# Patient Record
Sex: Male | Born: 2000 | Race: Black or African American | Hispanic: No | Marital: Single | Smoking: Never smoker
Health system: Southern US, Community
[De-identification: ages and names within clinical notes are randomized; demographics above are authoritative.]

---

## 2019-03-23 ENCOUNTER — Other Ambulatory Visit: Payer: Self-pay

## 2019-03-23 ENCOUNTER — Encounter (HOSPITAL_COMMUNITY): Payer: Self-pay | Admitting: Emergency Medicine

## 2019-03-23 ENCOUNTER — Emergency Department (HOSPITAL_COMMUNITY)
Admission: EM | Admit: 2019-03-23 | Discharge: 2019-03-24 | Disposition: A | Discharge status: Home / Self Care | Payer: Self-pay | Attending: Emergency Medicine | Admitting: Emergency Medicine

## 2019-03-23 ENCOUNTER — Emergency Department (HOSPITAL_COMMUNITY): Payer: Self-pay

## 2019-03-23 DIAGNOSIS — R55 Syncope and collapse: Secondary | ICD-10-CM | POA: Insufficient documentation

## 2019-03-23 DIAGNOSIS — Y999 Unspecified external cause status: Secondary | ICD-10-CM | POA: Insufficient documentation

## 2019-03-23 DIAGNOSIS — Y9239 Other specified sports and athletic area as the place of occurrence of the external cause: Secondary | ICD-10-CM | POA: Insufficient documentation

## 2019-03-23 DIAGNOSIS — W1830XA Fall on same level, unspecified, initial encounter: Secondary | ICD-10-CM | POA: Insufficient documentation

## 2019-03-23 DIAGNOSIS — M542 Cervicalgia: Secondary | ICD-10-CM | POA: Insufficient documentation

## 2019-03-23 DIAGNOSIS — S4991XA Unspecified injury of right shoulder and upper arm, initial encounter: Secondary | ICD-10-CM | POA: Insufficient documentation

## 2019-03-23 DIAGNOSIS — Y9357 Activity, non-running track and field events: Secondary | ICD-10-CM | POA: Insufficient documentation

## 2019-03-23 LAB — URINALYSIS, ROUTINE W REFLEX MICROSCOPIC
Bilirubin Urine: NEGATIVE
Glucose, UA: NEGATIVE mg/dL
Hgb urine dipstick: NEGATIVE
Ketones, ur: 5 mg/dL — AB
Leukocytes,Ua: NEGATIVE
Nitrite: NEGATIVE
Protein, ur: NEGATIVE mg/dL
Specific Gravity, Urine: 1.012 (ref 1.005–1.030)
pH: 6 (ref 5.0–8.0)

## 2019-03-23 LAB — CBC WITH DIFFERENTIAL/PLATELET
Abs Immature Granulocytes: 0.02 10*3/uL (ref 0.00–0.07)
Basophils Absolute: 0 10*3/uL (ref 0.0–0.1)
Basophils Relative: 1 %
Eosinophils Absolute: 0 10*3/uL (ref 0.0–0.5)
Eosinophils Relative: 0 %
HCT: 44.8 % (ref 39.0–52.0)
Hemoglobin: 15 g/dL (ref 13.0–17.0)
Immature Granulocytes: 0 %
Lymphocytes Relative: 22 %
Lymphs Abs: 1.5 10*3/uL (ref 0.7–4.0)
MCH: 27.8 pg (ref 26.0–34.0)
MCHC: 33.5 g/dL (ref 30.0–36.0)
MCV: 83 fL (ref 80.0–100.0)
Monocytes Absolute: 0.6 10*3/uL (ref 0.1–1.0)
Monocytes Relative: 10 %
Neutro Abs: 4.4 10*3/uL (ref 1.7–7.7)
Neutrophils Relative %: 67 %
Platelets: 266 10*3/uL (ref 150–400)
RBC: 5.4 MIL/uL (ref 4.22–5.81)
RDW: 12.3 % (ref 11.5–15.5)
WBC: 6.6 10*3/uL (ref 4.0–10.5)
nRBC: 0 % (ref 0.0–0.2)

## 2019-03-23 LAB — COMPREHENSIVE METABOLIC PANEL
ALT: 24 U/L (ref 0–44)
AST: 34 U/L (ref 15–41)
Albumin: 4.4 g/dL (ref 3.5–5.0)
Alkaline Phosphatase: 66 U/L (ref 38–126)
Anion gap: 12 (ref 5–15)
BUN: 12 mg/dL (ref 6–20)
CO2: 23 mmol/L (ref 22–32)
Calcium: 9.8 mg/dL (ref 8.9–10.3)
Chloride: 103 mmol/L (ref 98–111)
Creatinine, Ser: 1.24 mg/dL (ref 0.61–1.24)
GFR calc Af Amer: >60 mL/min (ref >60)
GFR calc non Af Amer: >60 mL/min (ref >60)
Glucose, Bld: 87 mg/dL (ref 70–99)
Potassium: 4.2 mmol/L (ref 3.5–5.1)
Sodium: 138 mmol/L (ref 135–145)
Total Bilirubin: 0.9 mg/dL (ref 0.3–1.2)
Total Protein: 8 g/dL (ref 6.5–8.1)

## 2019-03-23 LAB — RAPID URINE DRUG SCREEN, HOSP PERFORMED
Amphetamines: NOT DETECTED
Barbiturates: NOT DETECTED
Benzodiazepines: NOT DETECTED
Cocaine: NOT DETECTED
Opiates: NOT DETECTED
Tetrahydrocannabinol: NOT DETECTED

## 2019-03-23 LAB — CBG MONITORING, ED: Glucose-Capillary: 65 mg/dL — ABNORMAL LOW (ref 70–99)

## 2019-03-23 MED ORDER — LIDOCAINE 5 % EX PTCH: 1.0000 | MEDICATED_PATCH | CUTANEOUS | 0 refills | Status: AC

## 2019-03-23 MED ORDER — SODIUM CHLORIDE 0.9 % IV SOLN: INTRAVENOUS | Status: DC

## 2019-03-23 MED ORDER — SODIUM CHLORIDE 0.9 % IV BOLUS
1000.0000 mL | Freq: Once | INTRAVENOUS | Status: AC
  Administered 2019-03-23: 1000 mL via INTRAVENOUS

## 2019-03-23 MED ORDER — KETOROLAC TROMETHAMINE 30 MG/ML IJ SOLN
15.0000 mg | Freq: Once | INTRAMUSCULAR | Status: AC
  Administered 2019-03-23: 15 mg via INTRAVENOUS
  Filled 2019-03-23: qty 1

## 2019-03-23 MED ORDER — METHOCARBAMOL 500 MG PO TABS
500.0000 mg | ORAL_TABLET | Freq: Once | ORAL | Status: AC
  Administered 2019-03-23: 21:00:00 500 mg via ORAL
  Filled 2019-03-23: qty 1

## 2019-03-23 MED ORDER — METHOCARBAMOL 500 MG PO TABS: 500.0000 mg | ORAL_TABLET | Freq: Two times a day (BID) | ORAL | 0 refills | Status: AC
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
## 2019-03-23 NOTE — ED Notes (Signed)
Transported to radiology (xray-CT)
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
## 2019-03-23 NOTE — ED Notes (Signed)
Pt returned from radiology via stretcher

## 2019-03-26 ENCOUNTER — Other Ambulatory Visit: Payer: Self-pay

## 2019-03-26 ENCOUNTER — Encounter: Payer: Self-pay | Admitting: Internal Medicine

## 2019-03-26 ENCOUNTER — Ambulatory Visit (INDEPENDENT_AMBULATORY_CARE_PROVIDER_SITE_OTHER): Payer: Self-pay | Admitting: Internal Medicine

## 2019-03-26 VITALS — BP 122/80 | HR 72 | Temp 97.8°F | Ht 74.0 in | Wt 177.4 lb

## 2019-03-26 DIAGNOSIS — R55 Syncope and collapse: Secondary | ICD-10-CM

## 2019-03-26 NOTE — Patient Instructions (Signed)
Medication Instructions:  NO CHANGES *If you need a refill on your cardiac medications before your next appointment, please call your pharmacy*  Lab Work: NOT NEEDED If you have labs (blood work) drawn today and your tests are completely normal, you will receive your results only by: Marland Kitchen MyChart Message (if you have MyChart) OR . A paper copy in the mail If you have any lab test that is abnormal or we need to change your treatment, we will call you to review the results.  Testing/Procedures Your physician has requested that you have an echocardiogram. Echocardiography is a painless test that uses sound waves to create images of your heart. It provides your doctor with information about the size and shape of your heart and how well your heart's chambers and valves are working. This procedure takes approximately one hour. There are no restrictions for this procedure.    Follow-Up: At [facility], you and your health needs are our priority.  As part of our continuing mission to provide you with exceptional heart care, we have created designated Provider Care Teams.  These Care Teams include your primary Cardiologist (physician) and Advanced Practice Providers (APPs -  Physician Assistants and Nurse Practitioners) who all work together to provide you with the care you need, when you need it.  Your next appointment:   1 week(s)  The format for your next appointment:   Virtual Visit   Provider:   Weston Brass, MD  Other Instructions N/A

## 2019-03-26 NOTE — Progress Notes (Signed)
Cardiology Office Note:    Date:  03/26/2019   ID:  Keith Solomon, DOB [birth year], MRN [id number]  PCP:  Parke Poisson, MD  Cardiologist:  No primary care provider on file.  Electrophysiologist:  None   Referring MD: Anselm Pancoast, PA-C   Chief Complaint: possible syncopal episode  History of Present Illness:    Keith Solomon is a 19 y.o. male with no significant past medical history who presents today for evaluation of a possible syncopal episode versus trauma with loss of consciousness during practice.  He is a Holiday representative in excellent health.  He was at practice preparing for a race that involved hurdles.  He heard the starting gun go off and within a few seconds was that the first hurdle.  He remembers his foot catching on the hurdle and looking sideways at his coach.  He does not remember hitting his head.  Upon talking to his athletic trainer by phone at the end of our visit, she feels he did not lose consciousness in the air, but rather hit his head and had a concussion.  He was having a regular day for himself, and in fact was in good spirits, telling me he was pumping himself up to perform very well in practice raise.  He had an episode of chest pain in 2017 - 8/10 squeezing.  He underwent an evaluation in [place] where he was at the time.  He tells me a treadmill test was completely normal.  At the time he had a sharp pain across chest that improved with position change, massage.   He denies current chest pain, shortness of breath, palpitations.  Denies any other episodes of lifetime syncope or presyncope.  No known family history of accidental drowning's, sudden cardiac death, or other cardiovascular issues.  He has no known history of long QT syndrome.  No past medical history on file.  No past surgical history on file.  Current Medications: Current Meds  Medication Sig  . Acetaminophen-Caffeine (PANADOL EXTRA PO) Take 1 tablet by mouth  as needed (pain and headache).  . lidocaine (LIDODERM) 5 % Place 1 patch onto the skin daily. Remove & Discard patch within 12 hours or as directed by MD  . methocarbamol (ROBAXIN) 500 MG tablet Take 1 tablet (500 mg total) by mouth 2 (two) times daily.     Allergies:   Patient has no known allergies.   Social History   Socioeconomic History  . Marital status: Single    Spouse name: Not on file  . Number of children: Not on file  . Years of education: Not on file  . Highest education level: Not on file  Occupational History  . Not on file  Tobacco Use  . Smoking status: Never Smoker  . Smokeless tobacco: Never Used  Substance and Sexual Activity  . Alcohol use: Not Currently  . Drug use: Never  . Sexual activity: Not on file  Other Topics Concern  . Not on file  Social History Narrative  . Not on file   Social Determinants of Health   Financial Resource Strain:   . Difficulty of Paying Living Expenses: Not on file  Food Insecurity:   . Worried About Programme researcher, broadcasting/film/video in the Last Year: Not on file  . Ran Out of Food in the Last Year: Not on file  Transportation Needs:   . Lack of Transportation (Medical): Not on file  . Lack of Transportation (Non-Medical):  Not on file  Physical Activity:   . Days of Exercise per Week: Not on file  . Minutes of Exercise per Session: Not on file  Stress:   . Feeling of Stress : Not on file  Social Connections:   . Frequency of Communication with Friends and Family: Not on file  . Frequency of Social Gatherings with Friends and Family: Not on file  . Attends Religious Services: Not on file  . Active Member of Clubs or Organizations: Not on file  . Attends Banker Meetings: Not on file  . Marital Status: Not on file     Family History: The patient's family history is not on file.  Noted in HPI.  ROS:   Please see the history of present illness.    All other systems reviewed and are negative.  EKGs/Labs/Other  Studies Reviewed:    The following studies were reviewed today:  EKG:  NSR, normal QTc 429 ms  Recent Labs: 03/23/2019: ALT 24; BUN 12; Creatinine, Ser 1.24; Hemoglobin 15.0; Platelets 266; Potassium 4.2; Sodium 138  Recent Lipid Panel No results found for: CHOL, TRIG, HDL, CHOLHDL, VLDL, LDLCALC, LDLDIRECT  Physical Exam:    VS:  BP 122/80   Pulse 72   Temp 97.8 F (36.6 C)   Ht 6\' 2"  (1.88 m)   Wt 177 lb 6.4 oz (80.5 kg)   SpO2 98%   BMI 22.78 kg/m     Wt Readings from Last 5 Encounters:  03/26/19 177 lb 6.4 oz (80.5 kg)  03/23/19 175 lb 9.6 oz (79.7 kg)     Constitutional: No acute distress Eyes: sclera non-icteric, normal conjunctiva and lids ENMT: Mask in place over nose and mouth Cardiovascular: regular rhythm, normal rate, no murmurs. S1 and S2 normal. Radial pulses normal bilaterally. No jugular venous distention.  Respiratory: clear to auscultation bilaterally GI : normal bowel sounds, soft and nontender. No distention.   MSK: extremities warm, well perfused. No edema.  NEURO: grossly nonfocal exam, moves all extremities. PSYCH: alert and oriented x 3, normal mood and affect.   ASSESSMENT:    1. Syncope, unspecified syncope type    PLAN:    Possible syncopal episode-cannot entirely exclude a syncopal episode, however it would be unusual for him to have unexplained lone episode of syncope.  His athletic trainer feels that it what likely transpired was he tripped, hit his head on his shoulder and suffered a concussion with temporary loss of consciousness.  Since we cannot be certain, we will perform an evaluation with an echocardiogram.  This will be to evaluate for structural causes of syncope including LVOT obstruction and other issues that may contribute to arrhythmia.  I have counseled the patient on not driving.  Fortunately the patient does not currently drive.  If his echocardiogram is normal, it would be safe for him to return to play given the low likelihood  of malignant arrhythmia given the story.  Though a loud noise could contribute to a long QT syndrome, he has a normal QT interval on his ECG, and his syncope was not instantaneous after the loud sound.  We would however want to exclude malignant arrhythmia particularly during his activities.  I will have him wear a cardiac monitor for approximately 2 weeks.  This should not preclude him from activity unless symptoms continue.  Total time of encounter: 60 minutes total time of encounter, including 32 minutes spent in face-to-face patient care. This time includes coordination of care and counseling regarding  diagnosis and management of syncope and athlete. Remainder of non-face-to-face time involved reviewing chart documents/testing relevant to the patient encounter and documentation in the medical record.  I also spent approximately 5 minutes speaking to the patient's athletic trainer for collaborative history by phone due to COVID-19 pandemic.  Cherlynn Kaiser, MD [place]  [facility]   Medication Adjustments/Labs and Tests Ordered: Current medicines are reviewed at length with the patient today.  Concerns regarding medicines are outlined above.  Orders Placed This Encounter  Procedures  . EKG 12-Lead  . ECHOCARDIOGRAM COMPLETE   No orders of the defined types were placed in this encounter.   Patient Instructions  Medication Instructions:  NO CHANGES *If you need a refill on your cardiac medications before your next appointment, please call your pharmacy*  Lab Work: NOT NEEDED If you have labs (blood work) drawn today and your tests are completely normal, you will receive your results only by: Marland Kitchen MyChart Message (if you have MyChart) OR . A paper copy in the mail If you have any lab test that is abnormal or we need to change your treatment, we will call you to review the results.  Testing/Procedures Your physician has requested that you have an echocardiogram. Echocardiography  is a painless test that uses sound waves to create images of your heart. It provides your doctor with information about the size and shape of your heart and how well your heart's chambers and valves are working. This procedure takes approximately one hour. There are no restrictions for this procedure.    Follow-Up: At [facility], you and your health needs are our priority.  As part of our continuing mission to provide you with exceptional heart care, we have created designated Provider Care Teams.  These Care Teams include your primary Cardiologist (physician) and Advanced Practice Providers (APPs -  Physician Assistants and Nurse Practitioners) who all work together to provide you with the care you need, when you need it.  Your next appointment:   1 week(s)  The format for your next appointment:   Virtual Visit   Provider:   Cherlynn Kaiser, MD  Other Instructions N/A

## 2019-03-30 ENCOUNTER — Other Ambulatory Visit: Payer: Self-pay

## 2019-03-30 ENCOUNTER — Ambulatory Visit (HOSPITAL_COMMUNITY)
Admission: RE | Admit: 2019-03-30 | Discharge: 2019-03-30 | Disposition: A | Discharge status: Home / Self Care | Payer: BC Managed Care – PPO | Source: Ambulatory Visit | Attending: Internal Medicine | Admitting: Internal Medicine

## 2019-03-30 ENCOUNTER — Telehealth: Payer: Self-pay | Admitting: *Deleted

## 2019-03-30 DIAGNOSIS — R55 Syncope and collapse: Secondary | ICD-10-CM

## 2019-03-30 NOTE — Progress Notes (Signed)
Echocardiogram 2D Echocardiogram has been performed.  Keith Solomon 03/30/2019, 9:13 AM

## 2019-03-30 NOTE — Telephone Encounter (Signed)
Left message to call back -   Result to be given for echo   and discuss upcoming virtual appt 04/01/19, need schedule 2 week event monitor

## 2019-03-30 NOTE — Telephone Encounter (Addendum)
-   Keith Solomon A,MD  Normal Echo.   Let's order the 2 week event monitor, and have him participate in his usual activities. He can return to practice and competition from a cardiac standpoint.

## 2019-03-31 NOTE — Telephone Encounter (Signed)
The patient has been notified of the result and verbalized understanding.  All questions (if any) were answered. PATIENT IS AWARE CAN CANCELL APPT 04/01/19 NEED TO RESCHEDULE AFTER  ZIO MONITOR IS WORN  AWARE TO   Tobin Chad, RN 03/31/2019 9:25 AM

## 2019-04-01 ENCOUNTER — Telehealth: Payer: Self-pay | Admitting: Internal Medicine

## 2019-04-02 ENCOUNTER — Telehealth: Payer: Self-pay | Admitting: Internal Medicine

## 2019-04-06 ENCOUNTER — Other Ambulatory Visit: Payer: Self-pay | Admitting: Family Medicine

## 2019-04-06 DIAGNOSIS — M25511 Pain in right shoulder: Secondary | ICD-10-CM

## 2019-04-10 ENCOUNTER — Other Ambulatory Visit: Payer: Self-pay

## 2019-04-13 ENCOUNTER — Telehealth: Payer: Self-pay | Admitting: Internal Medicine

## 2019-04-13 NOTE — Telephone Encounter (Signed)
Katie, Event organiser for [employer]. She wanted to know if she would be able to pick up an event monitor for the patient to wear. The patient does not have a [place] mailing address, so there is no way to ship one to him locally.  Please reach out to her so she can arrange a trip to the office to pick it up

## 2019-04-13 NOTE — Telephone Encounter (Signed)
Routed to primary nurse, Andee Lineman, Kelle Darting

## 2019-04-14 ENCOUNTER — Encounter (INDEPENDENT_AMBULATORY_CARE_PROVIDER_SITE_OTHER): Payer: BC Managed Care – PPO

## 2019-04-14 ENCOUNTER — Ambulatory Visit
Admission: RE | Admit: 2019-04-14 | Discharge: 2019-04-14 | Disposition: A | Discharge status: Home / Self Care | Payer: BC Managed Care – PPO | Source: Ambulatory Visit | Attending: Family Medicine | Admitting: Family Medicine

## 2019-04-14 DIAGNOSIS — R55 Syncope and collapse: Secondary | ICD-10-CM | POA: Diagnosis not present

## 2019-04-14 DIAGNOSIS — M25511 Pain in right shoulder: Secondary | ICD-10-CM

## 2019-04-14 NOTE — Telephone Encounter (Signed)
Spoke with trainer she will stop by today and pick up

## 2019-04-14 NOTE — Telephone Encounter (Signed)
I spoke to his trainier on 2/10 she asked me to leave her a message on 2/11 if we had a monitor available. I called her on 2/11 and left her a message that his monitor would be available for pick up on 2/16 first thing in the morning. Patient was enrolled on 2/16 and monitor was placed for pickup downstairs at that time. I will call trainer and let her know its sitting downstairs waiting for her to pick up

## 2019-04-17 ENCOUNTER — Other Ambulatory Visit: Payer: Self-pay

## 2019-05-07 ENCOUNTER — Ambulatory Visit: Payer: BC Managed Care – PPO | Attending: Family

## 2019-05-07 DIAGNOSIS — Z23 Encounter for immunization: Secondary | ICD-10-CM

## 2019-05-07 NOTE — Progress Notes (Signed)
   Covid-19 Vaccination Clinic  Name:  Keith Solomon    MRN: [id number] DOB: 07-24-2000  05/07/2019  Keith Solomon was observed post Covid-19 immunization for 15 minutes without incident. He was provided with Vaccine Information Sheet and instruction to access the V-Safe system.   Keith Solomon was instructed to call 911 with any severe reactions post vaccine: Marland Kitchen Difficulty breathing  . Swelling of face and throat  . A fast heartbeat  . A bad rash all over body  . Dizziness and weakness   Immunizations Administered    Name Date Dose VIS Date Route   Moderna COVID-19 Vaccine 05/07/2019  1:09 PM 0.5 mL 01/20/2019 Intramuscular   Manufacturer: Moderna   Lot: [id number]   NDC: 59563-875-64

## 2019-06-09 ENCOUNTER — Ambulatory Visit: Payer: BC Managed Care – PPO | Attending: Family

## 2019-06-09 DIAGNOSIS — Z23 Encounter for immunization: Secondary | ICD-10-CM

## 2019-06-09 NOTE — Progress Notes (Signed)
   Covid-19 Vaccination Clinic  Name:  Keith Solomon    MRN: [id number] DOB: [birth year]  06/09/2019  [name] was observed post Covid-19 immunization for 15 minutes without incident. He was provided with Vaccine Information Sheet and instruction to access the V-Safe system.   [name] was instructed to call 911 with any severe reactions post vaccine: Marland Kitchen Difficulty breathing  . Swelling of face and throat  . A fast heartbeat  . A bad rash all over body  . Dizziness and weakness   Immunizations Administered    Name Date Dose VIS Date Route   Moderna COVID-19 Vaccine 06/09/2019 11:34 AM 0.5 mL 01/2019 Intramuscular   Manufacturer: Moderna   Lot: [id number]   NDC: 33383-291-91

## 2020-03-25 ENCOUNTER — Ambulatory Visit: Payer: BC Managed Care – PPO | Attending: Family

## 2020-03-25 DIAGNOSIS — Z23 Encounter for immunization: Secondary | ICD-10-CM

## 2020-07-13 NOTE — Progress Notes (Signed)
   Covid-19 Vaccination Clinic  Name:  Keith Solomon    MRN: [id number] DOB: [birth year]  07/13/2020  Keith Solomon was observed post Covid-19 immunization for 15 minutes without incident. He was provided with Vaccine Information Sheet and instruction to access the V-Safe system.   Keith Solomon was instructed to call 911 with any severe reactions post vaccine: Marland Kitchen Difficulty breathing  . Swelling of face and throat  . A fast heartbeat  . A bad rash all over body  . Dizziness and weakness   Immunizations Administered    Name Date Dose VIS Date Route   Moderna Covid-19 Booster Vaccine 03/25/2020  1:30 PM 0.25 mL 12/09/2019 Intramuscular   Manufacturer: Gala Murdoch   Lot: [id number]   NDC: 97353-299-24

## 2021-02-28 IMAGING — DX DG SHOULDER 2+V*R*
3 series · 3 of 3 positions shown · non-contrast
Comparison: None.

CLINICAL DATA: Pain status post fall

EXAM:
RIGHT SHOULDER - 2+ VIEW

[shoulder grashey]
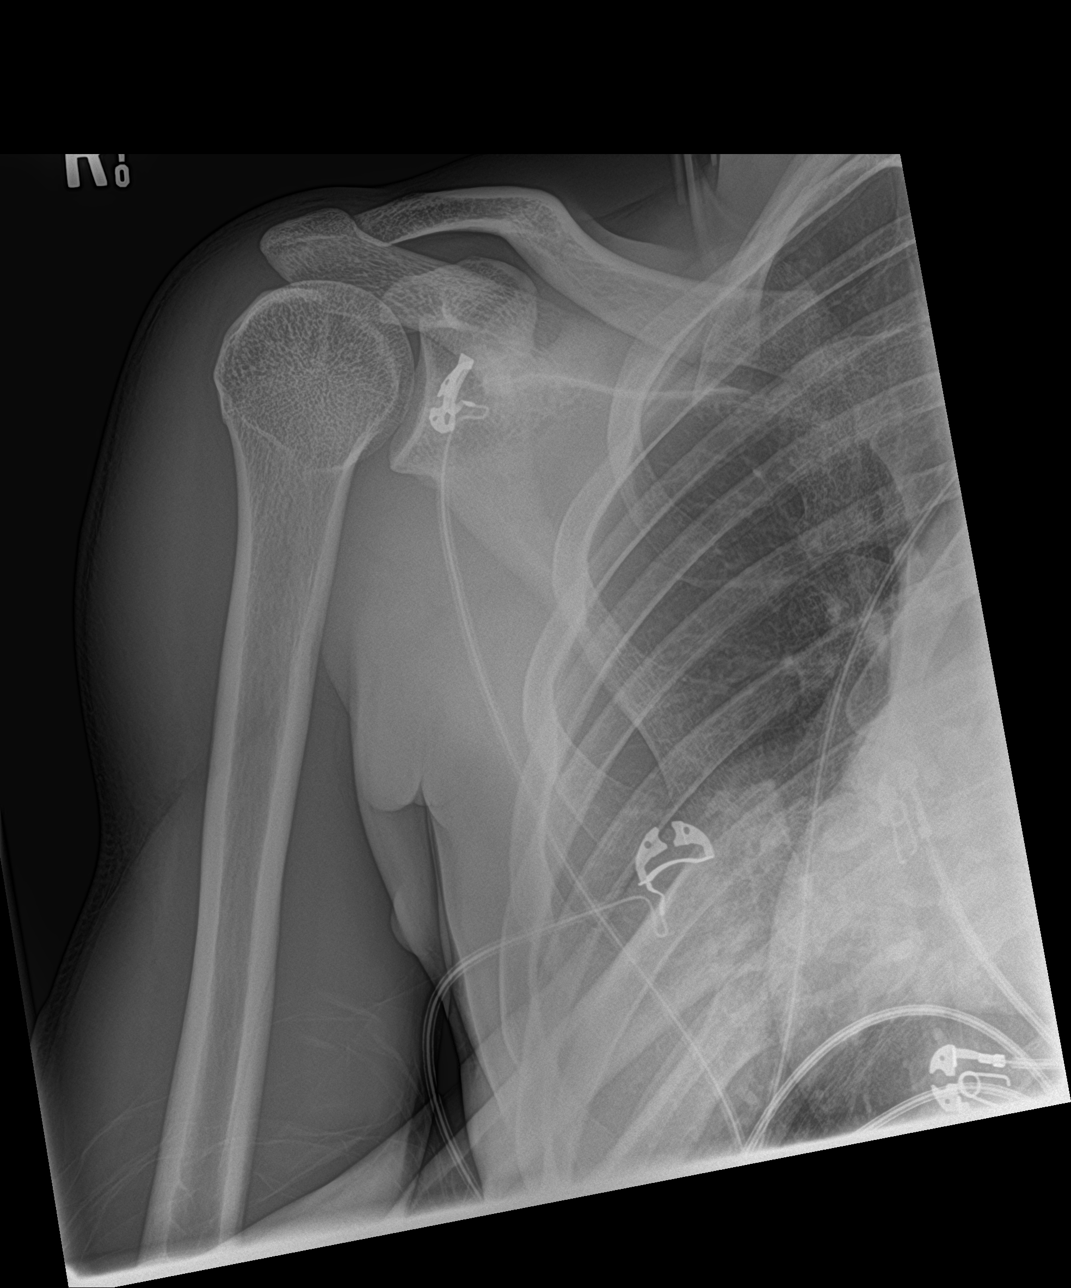

[shoulder y view]
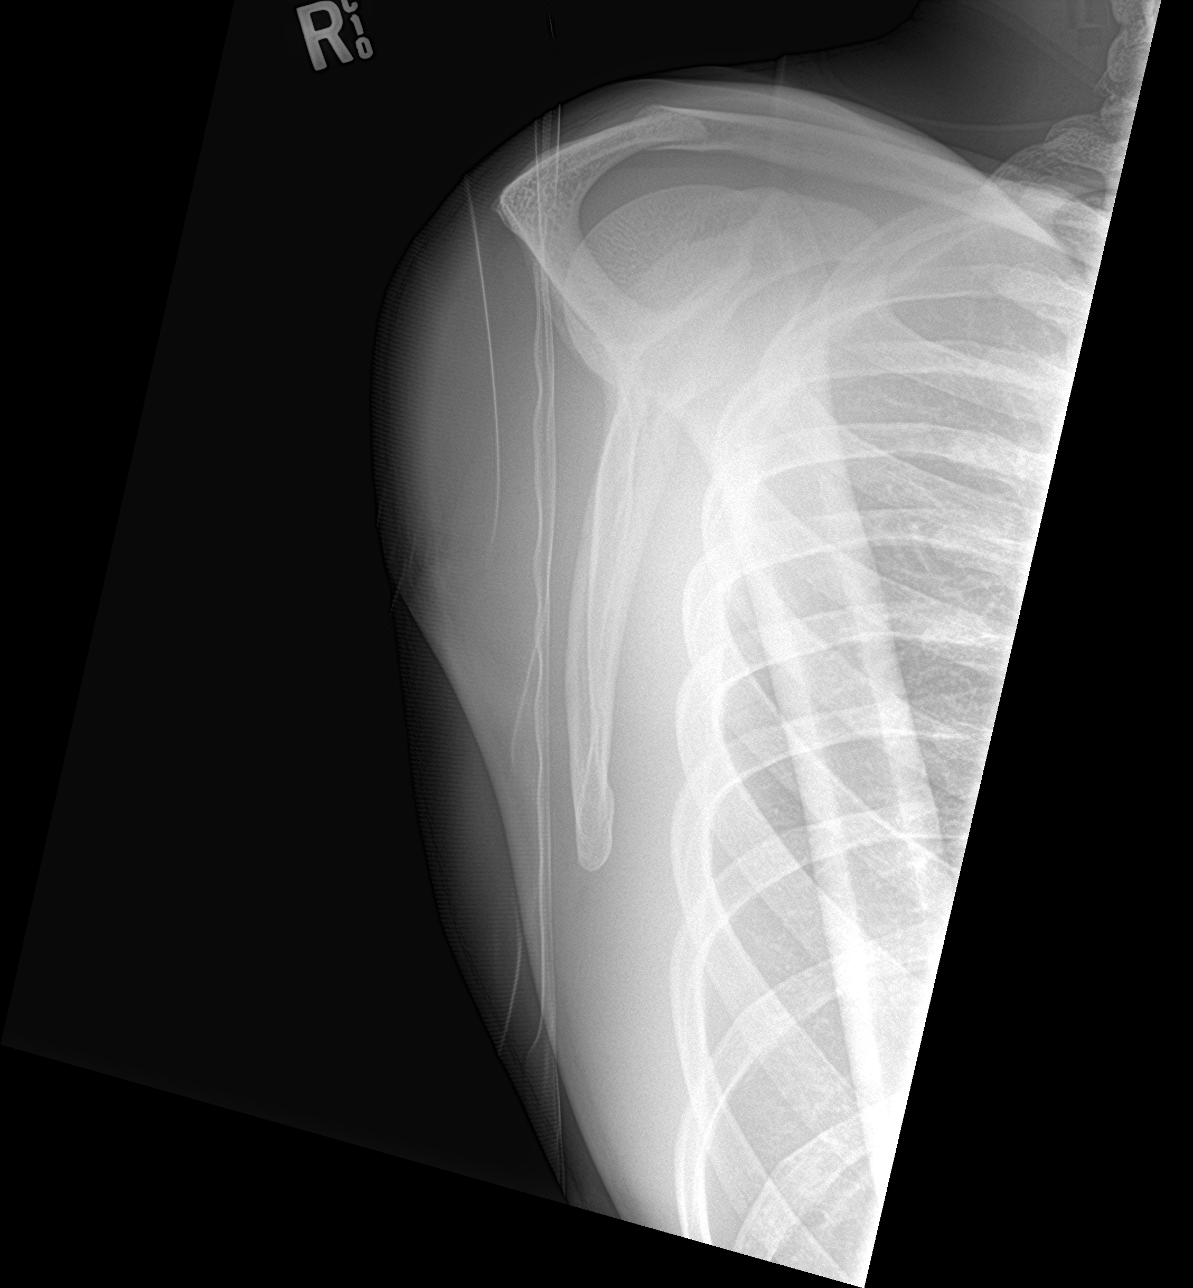

[shoulder axillary]
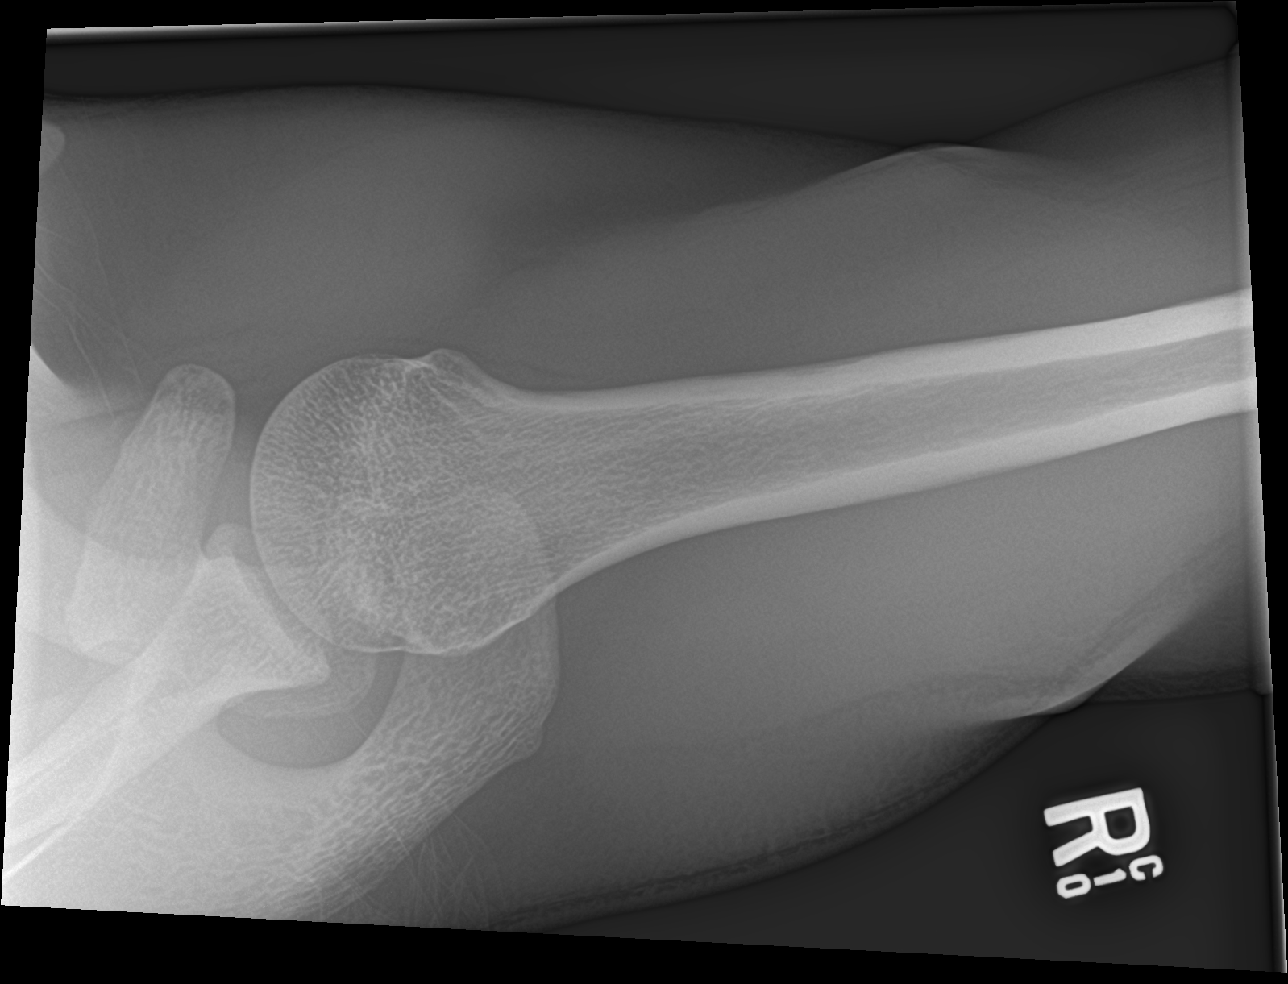

[3 of 3 positions shown; findings below may reference images not displayed]

FINDINGS: There is no evidence of fracture or dislocation. There is no
evidence of arthropathy or other focal bone abnormality. Soft
tissues are unremarkable.
IMPRESSION: Negative.

## 2021-02-28 IMAGING — DX DG CHEST 2V
2 series · 2 of 2 positions shown · non-contrast
Comparison: None.

CLINICAL DATA: Syncope.

EXAM:
CHEST - 2 VIEW

[chest lat]
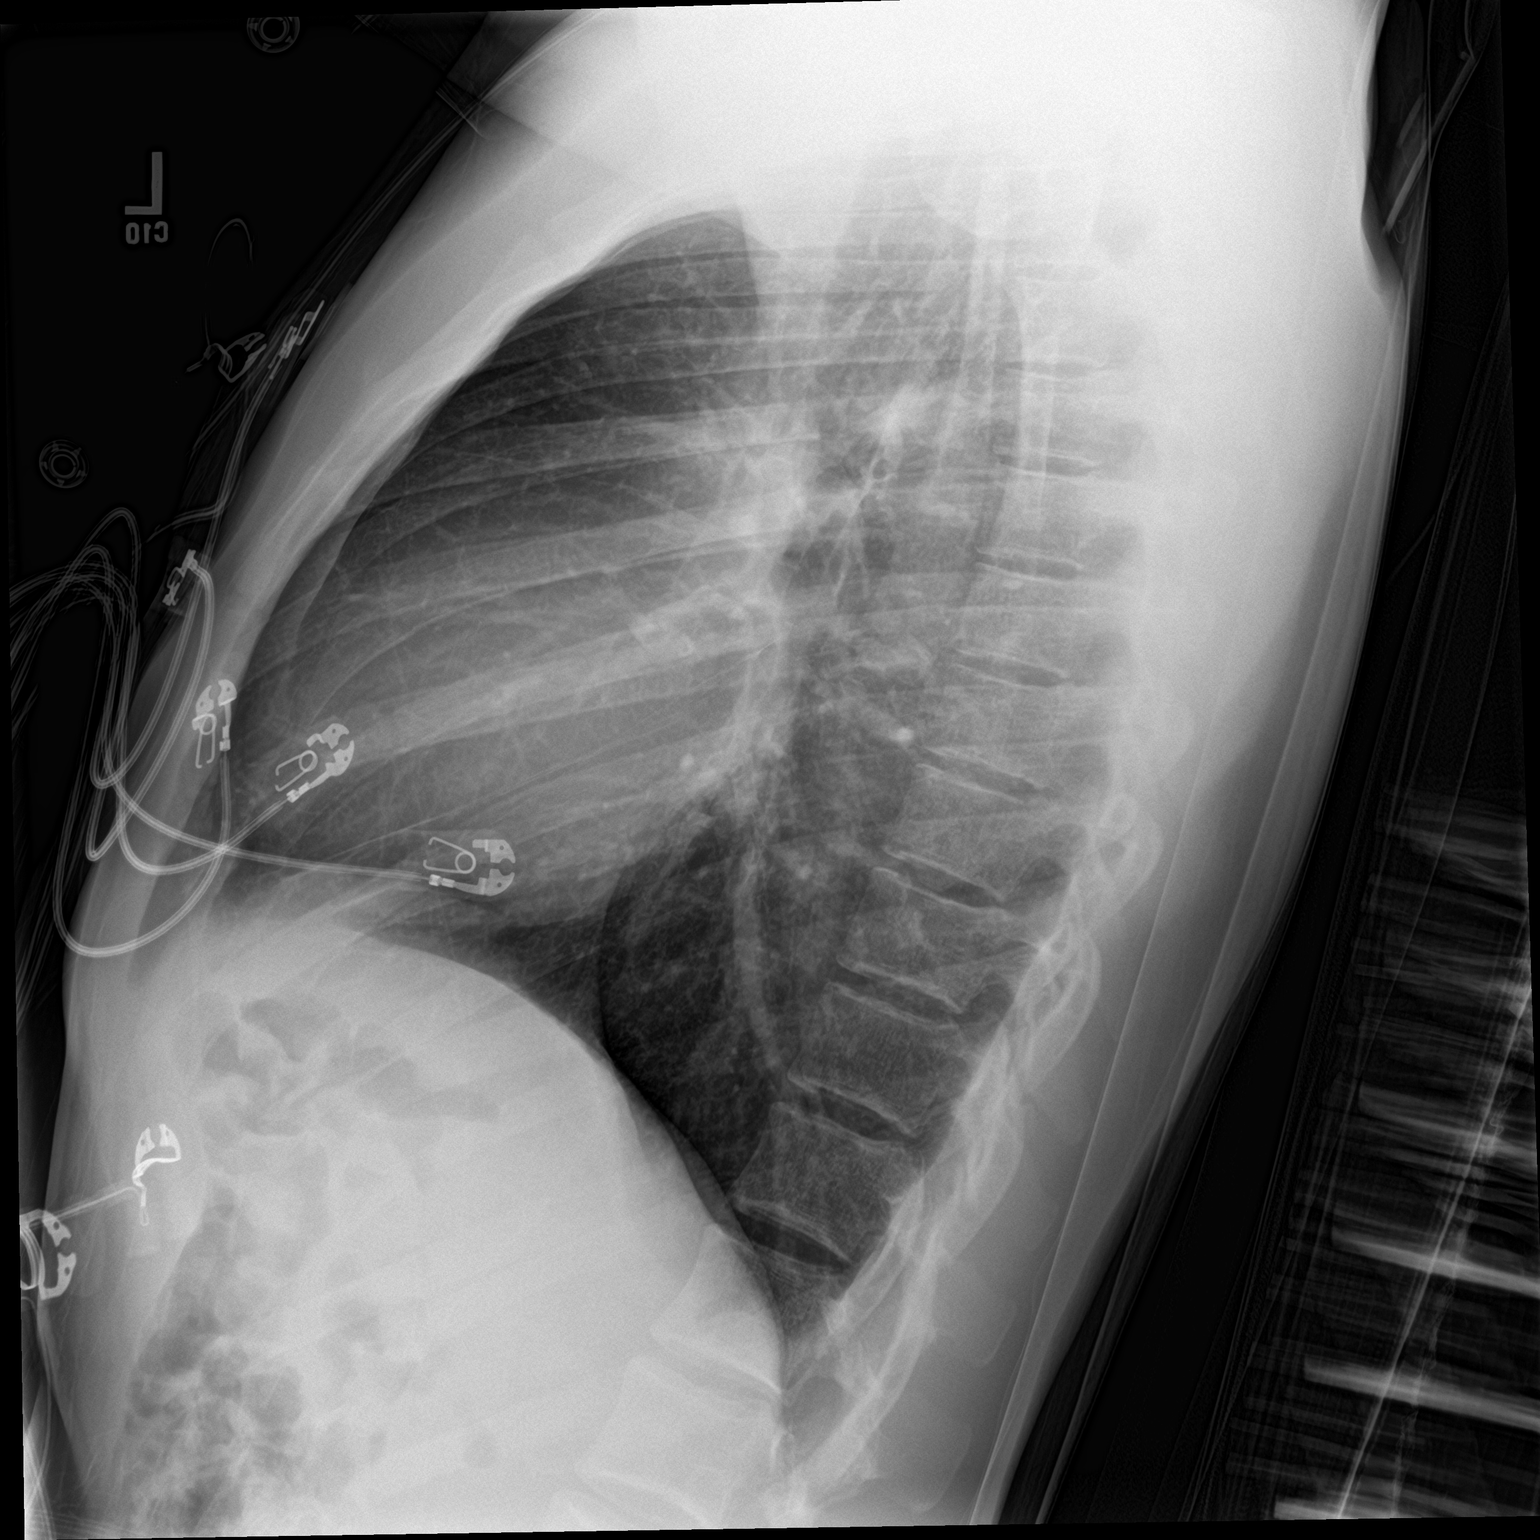

[chest ap]
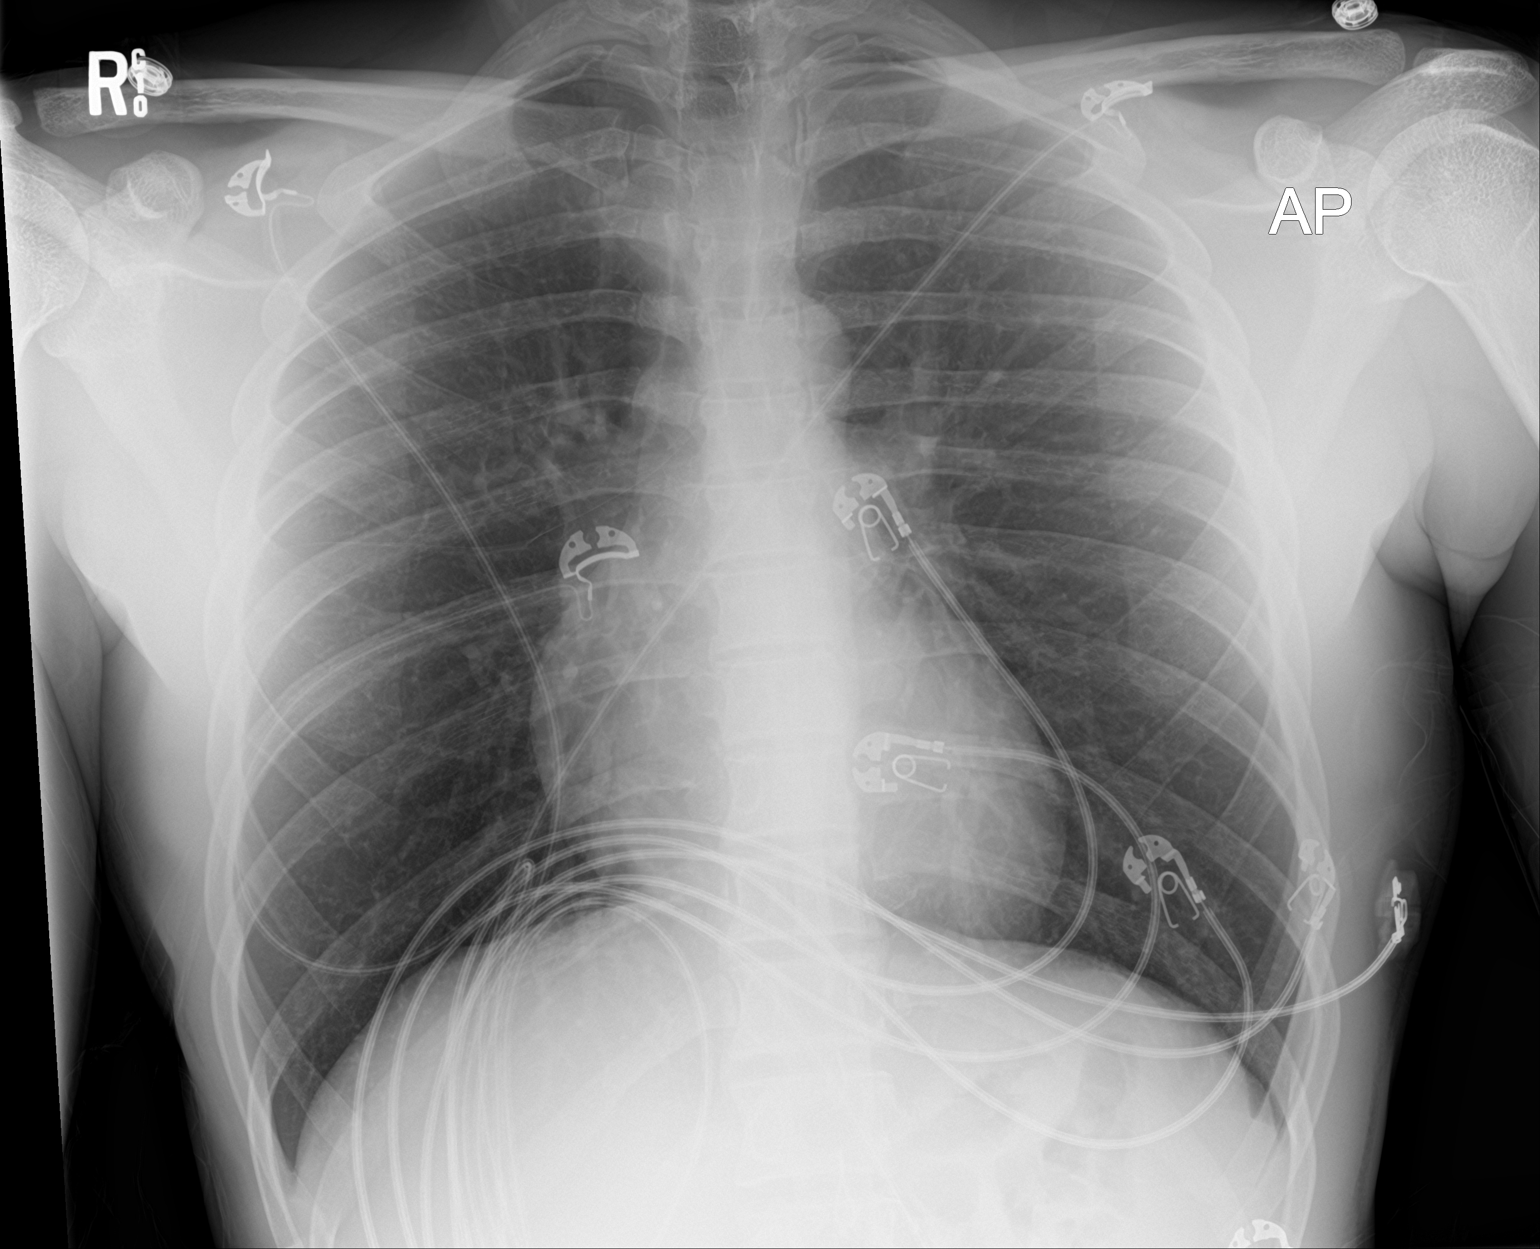

[2 of 2 positions shown; findings below may reference images not displayed]

FINDINGS: The heart size and mediastinal contours are within normal limits.
Both lungs are clear. The visualized skeletal structures are
unremarkable.
IMPRESSION: No active cardiopulmonary disease.
